# Patient Record
Sex: Male | Born: 1970 | Race: Black or African American | Hispanic: No | Marital: Married | Smoking: Current every day smoker
Health system: Southern US, Community
[De-identification: ages and names within clinical notes are randomized; demographics above are authoritative.]

## PROBLEM LIST (undated history)

## (undated) DIAGNOSIS — E119 Type 2 diabetes mellitus without complications: Secondary | ICD-10-CM

## (undated) HISTORY — DX: Type 2 diabetes mellitus without complications: E11.9

---

## 2020-03-28 ENCOUNTER — Emergency Department: Payer: BC Managed Care – PPO

## 2020-03-28 ENCOUNTER — Encounter: Payer: Self-pay | Admitting: *Deleted

## 2020-03-28 ENCOUNTER — Other Ambulatory Visit: Payer: Self-pay

## 2020-03-28 ENCOUNTER — Emergency Department
Admission: EM | Admit: 2020-03-28 | Discharge: 2020-03-28 | Disposition: A | Discharge status: Home / Self Care | Payer: BC Managed Care – PPO | Attending: Emergency Medicine | Admitting: Emergency Medicine

## 2020-03-28 DIAGNOSIS — S098XXA Other specified injuries of head, initial encounter: Secondary | ICD-10-CM | POA: Insufficient documentation

## 2020-03-28 DIAGNOSIS — Y999 Unspecified external cause status: Secondary | ICD-10-CM | POA: Diagnosis not present

## 2020-03-28 DIAGNOSIS — Y929 Unspecified place or not applicable: Secondary | ICD-10-CM | POA: Insufficient documentation

## 2020-03-28 DIAGNOSIS — S0990XA Unspecified injury of head, initial encounter: Secondary | ICD-10-CM

## 2020-03-28 DIAGNOSIS — E119 Type 2 diabetes mellitus without complications: Secondary | ICD-10-CM | POA: Diagnosis not present

## 2020-03-28 DIAGNOSIS — F17210 Nicotine dependence, cigarettes, uncomplicated: Secondary | ICD-10-CM | POA: Diagnosis not present

## 2020-03-28 DIAGNOSIS — Y939 Activity, unspecified: Secondary | ICD-10-CM | POA: Insufficient documentation

## 2020-03-28 DIAGNOSIS — S0083XA Contusion of other part of head, initial encounter: Secondary | ICD-10-CM

## 2020-03-28 DIAGNOSIS — S01512A Laceration without foreign body of oral cavity, initial encounter: Secondary | ICD-10-CM

## 2020-03-28 LAB — CBC WITH DIFFERENTIAL/PLATELET
Abs Immature Granulocytes: 0.11 10*3/uL — ABNORMAL HIGH (ref 0.00–0.07)
Basophils Absolute: 0.1 10*3/uL (ref 0.0–0.1)
Basophils Relative: 1 %
Eosinophils Absolute: 0 10*3/uL (ref 0.0–0.5)
Eosinophils Relative: 0 %
HCT: 43 % (ref 39.0–52.0)
Hemoglobin: 15.2 g/dL (ref 13.0–17.0)
Immature Granulocytes: 1 %
Lymphocytes Relative: 11 %
Lymphs Abs: 1.7 10*3/uL (ref 0.7–4.0)
MCH: 28.5 pg (ref 26.0–34.0)
MCHC: 35.3 g/dL (ref 30.0–36.0)
MCV: 80.5 fL (ref 80.0–100.0)
Monocytes Absolute: 0.7 10*3/uL (ref 0.1–1.0)
Monocytes Relative: 5 %
Neutro Abs: 13 10*3/uL — ABNORMAL HIGH (ref 1.7–7.7)
Neutrophils Relative %: 82 %
Platelets: 243 10*3/uL (ref 150–400)
RBC: 5.34 MIL/uL (ref 4.22–5.81)
RDW: 13.3 % (ref 11.5–15.5)
WBC: 15.6 10*3/uL — ABNORMAL HIGH (ref 4.0–10.5)
nRBC: 0 % (ref 0.0–0.2)

## 2020-03-28 LAB — ETHANOL: Alcohol, Ethyl (B): <10 mg/dL (ref <10)

## 2020-03-28 LAB — BASIC METABOLIC PANEL
Anion gap: 9 (ref 5–15)
BUN: 11 mg/dL (ref 6–20)
CO2: 28 mmol/L (ref 22–32)
Calcium: 9.5 mg/dL (ref 8.9–10.3)
Chloride: 102 mmol/L (ref 98–111)
Creatinine, Ser: 0.89 mg/dL (ref 0.61–1.24)
GFR calc Af Amer: >60 mL/min (ref >60)
GFR calc non Af Amer: >60 mL/min (ref >60)
Glucose, Bld: 299 mg/dL — ABNORMAL HIGH (ref 70–99)
Potassium: 4.3 mmol/L (ref 3.5–5.1)
Sodium: 139 mmol/L (ref 135–145)

## 2020-03-28 LAB — TROPONIN I (HIGH SENSITIVITY)
Troponin I (High Sensitivity): 9 ng/L (ref <18)
Troponin I (High Sensitivity): 7 ng/L (ref <18)

## 2020-03-28 MED ORDER — ONDANSETRON HCL 4 MG/2ML IJ SOLN
4.0000 mg | Freq: Once | INTRAMUSCULAR | Status: AC
  Administered 2020-03-28: 4 mg via INTRAVENOUS
  Filled 2020-03-28: qty 2

## 2020-03-28 MED ORDER — SODIUM CHLORIDE 0.9 % IV BOLUS
1000.0000 mL | Freq: Once | INTRAVENOUS | Status: AC
  Administered 2020-03-28: 1000 mL via INTRAVENOUS

## 2020-03-28 MED ORDER — ACETAMINOPHEN 325 MG PO TABS
650.0000 mg | ORAL_TABLET | Freq: Once | ORAL | Status: AC
  Administered 2020-03-28: 650 mg via ORAL
  Filled 2020-03-28: qty 2

## 2020-03-28 NOTE — ED Notes (Signed)
Hourly rounding reveals patient sleeping in room. No complaints, stable, in no acute distress. Q15 minute rounds.
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
## 2020-03-28 NOTE — Discharge Instructions (Addendum)
You may take Tylenol as needed for pain.  Apply ice to affected area several times daily to reduce swelling.  Return to the ER for worsening symptoms, persistent vomiting, lethargy or other concerns.  1. Occipital parietal soft tissue thickening is mild. More  pronounced frontal and glabellar scalp swelling slightly eccentric  to the left of midline with a crescentic hematoma measuring up to 8  mm in maximal thickness. No subjacent calvarial fracture. No acute  intracranial abnormality.  2. Suspect a minimally displaced fracture of the left nasal bone and  nasal spines with overlying swelling across the nasal bridge and  philtrum.  3. Mild periorbital soft tissue swelling as well without injury of  the orbital contents.  4. Mild soft tissue thickening with soft tissue radiodensities at  the chin, could reflect debris or benign dermal calcifications.  Correlate with visual inspection.  5. Multiple periapical lucencies detailed above. Correlate with  dental exam.

## 2020-03-28 NOTE — ED Notes (Signed)
Writer went in to talk with patient about discharge, patient then stated he was having some chest pain when he sat up, patient rated it 7/10 sitting up and sitting down. MD notified and EKG done

## 2020-03-28 NOTE — ED Notes (Signed)
Patient discharged home with wife, patient received discharge papers. Patient appropriate and cooperative, Vital signs taken. NAD noted.
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
## 2023-07-14 ENCOUNTER — Other Ambulatory Visit (HOSPITAL_COMMUNITY): Payer: Self-pay
